# Patient Record
Sex: Male | Born: 1990 | Race: Black or African American | Hispanic: No | Marital: Single | Smoking: Never smoker
Health system: Southern US, Community
[De-identification: ages and names within clinical notes are randomized; demographics above are authoritative.]

## PROBLEM LIST (undated history)

## (undated) DIAGNOSIS — R947 Abnormal results of other endocrine function studies: Secondary | ICD-10-CM

## (undated) DIAGNOSIS — I2109 ST elevation (STEMI) myocardial infarction involving other coronary artery of anterior wall: Secondary | ICD-10-CM

## (undated) DIAGNOSIS — Z9861 Coronary angioplasty status: Secondary | ICD-10-CM

## (undated) DIAGNOSIS — I2510 Atherosclerotic heart disease of native coronary artery without angina pectoris: Secondary | ICD-10-CM

## (undated) HISTORY — PX: OTHER SURGICAL HISTORY: SHX169

---

## 2014-09-29 ENCOUNTER — Emergency Department (HOSPITAL_COMMUNITY)

## 2014-09-29 ENCOUNTER — Encounter (HOSPITAL_COMMUNITY): Payer: Self-pay | Admitting: Cardiovascular Disease

## 2014-09-29 ENCOUNTER — Encounter (HOSPITAL_COMMUNITY): Admission: EM | Source: Home / Self Care | Attending: Cardiovascular Disease

## 2014-09-29 ENCOUNTER — Inpatient Hospital Stay (HOSPITAL_COMMUNITY)
Admission: EM | Admit: 2014-09-29 | Discharge: 2014-10-03 | DRG: 249 | Attending: Cardiovascular Disease | Admitting: Cardiovascular Disease

## 2014-09-29 ENCOUNTER — Inpatient Hospital Stay (HOSPITAL_COMMUNITY)

## 2014-09-29 DIAGNOSIS — I2510 Atherosclerotic heart disease of native coronary artery without angina pectoris: Secondary | ICD-10-CM

## 2014-09-29 DIAGNOSIS — Z9861 Coronary angioplasty status: Secondary | ICD-10-CM

## 2014-09-29 DIAGNOSIS — R001 Bradycardia, unspecified: Secondary | ICD-10-CM | POA: Diagnosis not present

## 2014-09-29 DIAGNOSIS — I252 Old myocardial infarction: Secondary | ICD-10-CM | POA: Diagnosis not present

## 2014-09-29 DIAGNOSIS — I2111 ST elevation (STEMI) myocardial infarction involving right coronary artery: Secondary | ICD-10-CM

## 2014-09-29 DIAGNOSIS — I2102 ST elevation (STEMI) myocardial infarction involving left anterior descending coronary artery: Secondary | ICD-10-CM

## 2014-09-29 DIAGNOSIS — I2109 ST elevation (STEMI) myocardial infarction involving other coronary artery of anterior wall: Principal | ICD-10-CM

## 2014-09-29 DIAGNOSIS — W19XXXA Unspecified fall, initial encounter: Secondary | ICD-10-CM

## 2014-09-29 DIAGNOSIS — Z79899 Other long term (current) drug therapy: Secondary | ICD-10-CM | POA: Diagnosis not present

## 2014-09-29 DIAGNOSIS — R0789 Other chest pain: Secondary | ICD-10-CM | POA: Diagnosis not present

## 2014-09-29 DIAGNOSIS — R109 Unspecified abdominal pain: Secondary | ICD-10-CM | POA: Diagnosis present

## 2014-09-29 DIAGNOSIS — Z7902 Long term (current) use of antithrombotics/antiplatelets: Secondary | ICD-10-CM | POA: Diagnosis not present

## 2014-09-29 DIAGNOSIS — Z7982 Long term (current) use of aspirin: Secondary | ICD-10-CM | POA: Diagnosis not present

## 2014-09-29 DIAGNOSIS — R079 Chest pain, unspecified: Secondary | ICD-10-CM | POA: Diagnosis present

## 2014-09-29 HISTORY — PX: LEFT HEART CATHETERIZATION WITH CORONARY ANGIOGRAM: SHX5451

## 2014-09-29 HISTORY — DX: Coronary angioplasty status: Z98.61

## 2014-09-29 HISTORY — DX: ST elevation (STEMI) myocardial infarction involving other coronary artery of anterior wall: I21.09

## 2014-09-29 HISTORY — DX: Abnormal results of other endocrine function studies: R94.7

## 2014-09-29 HISTORY — DX: Atherosclerotic heart disease of native coronary artery without angina pectoris: I25.10

## 2014-09-29 LAB — CBC WITH DIFFERENTIAL/PLATELET
BASOS ABS: 0 10*3/uL (ref 0.0–0.1)
Basophils Relative: 0 % (ref 0–1)
EOS ABS: 0 10*3/uL (ref 0.0–0.7)
Eosinophils Relative: 0 % (ref 0–5)
HCT: 41.7 % (ref 39.0–52.0)
HEMOGLOBIN: 14.7 g/dL (ref 13.0–17.0)
Lymphocytes Relative: 13 % (ref 12–46)
Lymphs Abs: 1.6 10*3/uL (ref 0.7–4.0)
MCH: 31.4 pg (ref 26.0–34.0)
MCHC: 35.3 g/dL (ref 30.0–36.0)
MCV: 89.1 fL (ref 78.0–100.0)
MONOS PCT: 4 % (ref 3–12)
Monocytes Absolute: 0.6 10*3/uL (ref 0.1–1.0)
Neutro Abs: 10.8 10*3/uL — ABNORMAL HIGH (ref 1.7–7.7)
Neutrophils Relative %: 83 % — ABNORMAL HIGH (ref 43–77)
Platelets: 334 10*3/uL (ref 150–400)
RBC: 4.68 MIL/uL (ref 4.22–5.81)
RDW: 12.4 % (ref 11.5–15.5)
WBC: 13 10*3/uL — ABNORMAL HIGH (ref 4.0–10.5)

## 2014-09-29 LAB — BASIC METABOLIC PANEL
ANION GAP: 19 — AB (ref 5–15)
BUN: 14 mg/dL (ref 6–23)
CO2: 20 mEq/L (ref 19–32)
CREATININE: 1.29 mg/dL (ref 0.50–1.35)
Calcium: 10.6 mg/dL — ABNORMAL HIGH (ref 8.4–10.5)
Chloride: 102 mEq/L (ref 96–112)
GFR calc Af Amer: >90 mL/min (ref >90)
GFR, EST NON AFRICAN AMERICAN: 78 mL/min — AB (ref >90)
Glucose, Bld: 86 mg/dL (ref 70–99)
Potassium: 3.8 mEq/L (ref 3.7–5.3)
Sodium: 141 mEq/L (ref 137–147)

## 2014-09-29 LAB — MAGNESIUM: Magnesium: 2 mg/dL (ref 1.5–2.5)

## 2014-09-29 LAB — TROPONIN I: Troponin I: 0.4 ng/mL (ref <0.30)

## 2014-09-29 LAB — POCT ACTIVATED CLOTTING TIME: Activated Clotting Time: 653 seconds

## 2014-09-29 LAB — I-STAT TROPONIN, ED: Troponin i, poc: 0.21 ng/mL (ref 0.00–0.08)

## 2014-09-29 LAB — MRSA PCR SCREENING: MRSA by PCR: NEGATIVE

## 2014-09-29 SURGERY — LEFT HEART CATHETERIZATION WITH CORONARY ANGIOGRAM
Anesthesia: LOCAL

## 2014-09-29 MED ORDER — HEPARIN (PORCINE) IN NACL 2-0.9 UNIT/ML-% IJ SOLN
INTRAMUSCULAR | Status: AC
  Filled 2014-09-29: qty 1000

## 2014-09-29 MED ORDER — ASPIRIN 81 MG PO CHEW
CHEWABLE_TABLET | ORAL | Status: AC
  Filled 2014-09-29: qty 4

## 2014-09-29 MED ORDER — HEPARIN BOLUS VIA INFUSION
4000.0000 [IU] | Freq: Once | INTRAVENOUS | Status: AC
  Administered 2014-09-29: 5000 [IU] via INTRAVENOUS
  Filled 2014-09-29: qty 4000

## 2014-09-29 MED ORDER — NITROGLYCERIN 0.4 MG SL SUBL: 0.4000 mg | SUBLINGUAL_TABLET | SUBLINGUAL | Status: DC | PRN

## 2014-09-29 MED ORDER — SODIUM CHLORIDE 0.9 % IV SOLN: INTRAVENOUS | Status: DC

## 2014-09-29 MED ORDER — TICAGRELOR 90 MG PO TABS
ORAL_TABLET | ORAL | Status: AC
  Filled 2014-09-29: qty 1

## 2014-09-29 MED ORDER — ASPIRIN 81 MG PO CHEW
324.0000 mg | CHEWABLE_TABLET | Freq: Once | ORAL | Status: AC
  Administered 2014-09-29: 324 mg via ORAL

## 2014-09-29 MED ORDER — HEPARIN (PORCINE) IN NACL 2-0.9 UNIT/ML-% IJ SOLN
INTRAMUSCULAR | Status: AC
  Filled 2014-09-29: qty 500

## 2014-09-29 MED ORDER — MORPHINE SULFATE 2 MG/ML IJ SOLN
INTRAMUSCULAR | Status: AC
  Filled 2014-09-29: qty 1

## 2014-09-29 MED ORDER — MORPHINE SULFATE 2 MG/ML IJ SOLN
2.0000 mg | INTRAMUSCULAR | Status: DC | PRN
  Administered 2014-09-29 – 2014-09-30 (×3): 2 mg via INTRAVENOUS
  Filled 2014-09-29 (×3): qty 1

## 2014-09-29 MED ORDER — ATORVASTATIN CALCIUM 80 MG PO TABS
80.0000 mg | ORAL_TABLET | Freq: Every day | ORAL | Status: DC
  Administered 2014-09-29 – 2014-10-02 (×4): 80 mg via ORAL
  Filled 2014-09-29 (×4): qty 1

## 2014-09-29 MED ORDER — HEPARIN SODIUM (PORCINE) 5000 UNIT/ML IJ SOLN: 60.0000 [IU]/kg | Freq: Once | INTRAMUSCULAR | Status: DC

## 2014-09-29 MED ORDER — ONDANSETRON HCL 4 MG/2ML IJ SOLN
4.0000 mg | Freq: Four times a day (QID) | INTRAMUSCULAR | Status: DC | PRN
  Administered 2014-09-30: 4 mg via INTRAVENOUS
  Filled 2014-09-29: qty 2

## 2014-09-29 MED ORDER — FENTANYL CITRATE 0.05 MG/ML IJ SOLN
INTRAMUSCULAR | Status: AC
  Filled 2014-09-29: qty 2

## 2014-09-29 MED ORDER — HEPARIN SODIUM (PORCINE) 1000 UNIT/ML IJ SOLN
INTRAMUSCULAR | Status: AC
  Filled 2014-09-29: qty 1

## 2014-09-29 MED ORDER — OXYCODONE-ACETAMINOPHEN 5-325 MG PO TABS: 1.0000 | ORAL_TABLET | ORAL | Status: DC | PRN

## 2014-09-29 MED ORDER — ASPIRIN 81 MG PO CHEW
81.0000 mg | CHEWABLE_TABLET | Freq: Every day | ORAL | Status: DC
  Administered 2014-09-30 – 2014-10-03 (×4): 81 mg via ORAL
  Filled 2014-09-29 (×4): qty 1

## 2014-09-29 MED ORDER — NITROGLYCERIN 1 MG/10 ML FOR IR/CATH LAB
INTRA_ARTERIAL | Status: AC
  Filled 2014-09-29: qty 10

## 2014-09-29 MED ORDER — METOPROLOL TARTRATE 25 MG PO TABS
25.0000 mg | ORAL_TABLET | Freq: Two times a day (BID) | ORAL | Status: DC
  Administered 2014-09-30 – 2014-10-01 (×2): 25 mg via ORAL
  Filled 2014-09-29 (×5): qty 1

## 2014-09-29 MED ORDER — NITROGLYCERIN 0.4 MG SL SUBL
SUBLINGUAL_TABLET | SUBLINGUAL | Status: AC
  Filled 2014-09-29: qty 1

## 2014-09-29 MED ORDER — TICAGRELOR 90 MG PO TABS
90.0000 mg | ORAL_TABLET | Freq: Two times a day (BID) | ORAL | Status: DC
  Administered 2014-09-30 – 2014-10-03 (×7): 90 mg via ORAL
  Filled 2014-09-29 (×8): qty 1

## 2014-09-29 MED ORDER — LIDOCAINE HCL (PF) 1 % IJ SOLN
INTRAMUSCULAR | Status: AC
  Filled 2014-09-29: qty 30

## 2014-09-29 MED ORDER — MORPHINE SULFATE 4 MG/ML IJ SOLN: 4.0000 mg | INTRAMUSCULAR | Status: DC | PRN

## 2014-09-29 MED ORDER — BIVALIRUDIN 250 MG IV SOLR
INTRAVENOUS | Status: AC
  Filled 2014-09-29: qty 250

## 2014-09-29 MED ORDER — VERAPAMIL HCL 2.5 MG/ML IV SOLN
INTRAVENOUS | Status: AC
  Filled 2014-09-29: qty 2

## 2014-09-29 MED ORDER — MIDAZOLAM HCL 2 MG/2ML IJ SOLN
INTRAMUSCULAR | Status: AC
  Filled 2014-09-29: qty 2

## 2014-09-29 MED ORDER — ONDANSETRON HCL 4 MG/2ML IJ SOLN: 4.0000 mg | Freq: Once | INTRAMUSCULAR | Status: DC

## 2014-09-29 MED ORDER — ACETAMINOPHEN 325 MG PO TABS: 650.0000 mg | ORAL_TABLET | ORAL | Status: DC | PRN

## 2014-09-29 MED ORDER — METOPROLOL TARTRATE 1 MG/ML IV SOLN
INTRAVENOUS | Status: AC
  Filled 2014-09-29: qty 5

## 2014-09-29 MED ORDER — HEPARIN (PORCINE) IN NACL 100-0.45 UNIT/ML-% IJ SOLN
1050.0000 [IU]/h | INTRAMUSCULAR | Status: DC
  Filled 2014-09-29: qty 250
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
## 2014-09-30 ENCOUNTER — Encounter (HOSPITAL_COMMUNITY): Payer: Self-pay | Admitting: Neurology

## 2014-09-30 DIAGNOSIS — R109 Unspecified abdominal pain: Secondary | ICD-10-CM

## 2014-09-30 DIAGNOSIS — R072 Precordial pain: Secondary | ICD-10-CM

## 2014-09-30 DIAGNOSIS — I2510 Atherosclerotic heart disease of native coronary artery without angina pectoris: Secondary | ICD-10-CM

## 2014-09-30 DIAGNOSIS — Z9861 Coronary angioplasty status: Secondary | ICD-10-CM

## 2014-09-30 DIAGNOSIS — I2109 ST elevation (STEMI) myocardial infarction involving other coronary artery of anterior wall: Principal | ICD-10-CM

## 2014-09-30 LAB — RAPID URINE DRUG SCREEN, HOSP PERFORMED
AMPHETAMINES: NOT DETECTED
BENZODIAZEPINES: POSITIVE — AB
Barbiturates: NOT DETECTED
Cocaine: NOT DETECTED
OPIATES: POSITIVE — AB
Tetrahydrocannabinol: POSITIVE — AB

## 2014-09-30 LAB — LIPID PANEL
Cholesterol: 135 mg/dL (ref 0–200)
HDL: 38 mg/dL — ABNORMAL LOW (ref >39)
LDL CALC: 80 mg/dL (ref 0–99)
Total CHOL/HDL Ratio: 3.6 RATIO
Triglycerides: 84 mg/dL (ref <150)
VLDL: 17 mg/dL (ref 0–40)

## 2014-09-30 LAB — TROPONIN I
TROPONIN I: 3.86 ng/mL — AB (ref <0.30)
Troponin I: 16.35 ng/mL (ref <0.30)
Troponin I: 12.23 ng/mL (ref <0.30)

## 2014-09-30 MED FILL — Sodium Chloride IV Soln 0.9%: INTRAVENOUS | Qty: 50 | Status: AC

## 2014-09-30 NOTE — Progress Notes (Signed)
CARDIAC REHAB PHASE I   PRE:  Rate/Rhythm: 66 SR  BP:  Supine:   Sitting: 127/64  Standing:    SaO2:   MODE:  Ambulation: 226 ft   POST:  Rate/Rhythm: 78 SR to 54 SB then to 84 SR  BP:  Supine 111/68    136/76 Sitting:   Standing:    SaO2: 100% RA  Put on 2L 1330-1405 Pt standing with his RN when I entered room getting ready to walk so I took over. Encouraged pt to let me know if any CP or dizziness. Pt walked 150 ft when he stopped suddenly and closed eyes. Stated he was nauseated and felt weak. Had chair brought to pt and he sat encouraging him to take deep breaths. Heart rate stable then. Pt able to walk 76 ft back to room and sat on side of bed. He put head down and heart rate started dropping to 54. Told pt to lie down and helped him with his feet. Heart rate immediately to 84. Asked pt if he felt like he was going to pass out when he was sitting and he said yes. BP at 111/68 and then to 136/76. Pt started rubbing chest. Asked him if chest hurting and he stated yes. 4 on scale. Put on 2L oxygen which did not lessen pain and notified pt's RN who came in. Pt medicated.  Asked if pt could possibly sit in recliner later when feeling better to get used to position change and officers said they could shackle to recliner. We will follow up tomorrow.   Luetta Nuttingharlene Noelle Sease, RN BSN  09/30/2014 2:00 PM

## 2014-09-30 NOTE — Plan of Care (Signed)
Problem: Consults Goal: Tobacco Cessation referral if indicated Outcome: Not Applicable Date Met:  09/30/14     

## 2014-09-30 NOTE — Care Management Note (Signed)
    Page 1 of 1   09/30/2014     10:08:52 AM CARE MANAGEMENT NOTE 09/30/2014  Patient:  William Hanna,William Hanna   Account Number:  [id number]  Date Initiated:  09/30/2014  Documentation initiated by:  William Hanna  Subjective/Objective Assessment:   adm w mi     Action/Plan:   was at home pta   Anticipated DC Date:     Anticipated DC Plan:  [facility] FAC[facility]      DC Planning Services  CM consult  Medication Assistance      Choice offered to / List presented to:             Status of service:   Medicare Important Message given?   (If response is "NO", the following Medicare IM given date fields will be blank) Date Medicare IM given:   Medicare IM given by:   Date Additional Medicare IM given:   Additional Medicare IM given by:    Discharge Disposition:    Per UR Regulation:  Reviewed for med. necessity/level of care/duration of stay  If discussed at Long Length of Stay Meetings, dates discussed:    Comments:  11/12 1007 William Hanna pt has police in room guarding him. left brilinta 30day free card w guard. placed brilinta pt assist form on shadow chart.

## 2014-09-30 NOTE — Progress Notes (Signed)
CRITICAL VALUE ALERT  Critical value received:  Troponin 3.86    Date of notification:09/30/2014  Time of notification:  0054  Critical value read back:Yes.    Nurse who received alert:  Kavin LeechSondra Garett Tetzloff, RN   MD notified (1st page):  Critical value expected, MD aware   Ivery QualeJackson, Quinton Voth A, RN

## 2014-09-30 NOTE — Progress Notes (Signed)
  Echocardiogram 2D Echocardiogram has been performed.  [name], William Hanna 09/30/2014, 5:09 PM

## 2014-09-30 NOTE — Progress Notes (Signed)
SUBJECTIVE:  Doing well, denies any ongoing chest pain or SOB. Chest tender to palpation.  Right flank tenderness.  OBJECTIVE:   Vitals:   Filed Vitals:   09/30/14 0700 09/30/14 0800 09/30/14 0900 09/30/14 1000  BP: 114/55 113/96 131/60 133/75  Pulse: 55 84 59 58  Temp:  98.3 F (36.8 C)    TempSrc:  Oral    Resp: 13 21 15 16   Height:      Weight:      SpO2: 98% 100% 99% 99%   I&O's:   Intake/Output Summary (Last 24 hours) at 09/30/14 1126 Last data filed at 09/30/14 0215  Gross per 24 hour  Intake    535 ml  Output    375 ml  Net    160 ml   TELEMETRY: Reviewed telemetry pt in NSR:   PHYSICAL EXAM General: Well developed, well nourished, in no acute distress Head:   Normal cephalic and atramatic  Lungs:  Clear bilaterally to auscultation. Heart:  HRRR S1 S2  No JVD.   Abdomen: abdomen soft, RUQ tenderness Msk: right flank tenderness, tenderness of left sternal boarder. Extremities:  No edema.  Right radial access site c/d/i. Minimal tenderness Neuro: Alert and oriented. Psych:  Normal affect, responds appropriately Skin: No rash   LABS: Basic Metabolic Panel:  Recent Labs  81/19/1410/10/03 1814  NA 141  K 3.8  CL 102  CO2 20  GLUCOSE 86  BUN 14  CREATININE 1.29  CALCIUM 10.6*  MG 2.0   Liver Function Tests: No results for input(s): AST, ALT, ALKPHOS, BILITOT, PROT, ALBUMIN in the last 72 hours. No results for input(s): LIPASE, AMYLASE in the last 72 hours. CBC:  Recent Labs  09/29/14 1814  WBC 13.0*  NEUTROABS 10.8*  HGB 14.7  HCT 41.7  MCV 89.1  PLT 334   Cardiac Enzymes:  Recent Labs  09/29/14 1814 09/29/14 2355 09/30/14 0235  TROPONINI 0.40* 3.86* 16.35*   BNP: Invalid input(s): POCBNP D-Dimer: No results for input(s): DDIMER in the last 72 hours. Hemoglobin A1C: No results for input(s): HGBA1C in the last 72 hours. Fasting Lipid Panel:  Recent Labs  09/30/14 0235  CHOL 135  HDL 38*  LDLCALC 80  TRIG 84  CHOLHDL 3.6    Thyroid Function Tests: No results for input(s): TSH, T4TOTAL, T3FREE, THYROIDAB in the last 72 hours.  Invalid input(s): FREET3 Anemia Panel: No results for input(s): VITAMINB12, FOLATE, FERRITIN, TIBC, IRON, RETICCTPCT in the last 72 hours. Coag Panel:   No results found for: INR, PROTIME  RADIOLOGY: Dg Chest 2 View  09/29/2014   CLINICAL DATA:  Larey SeatFell and hit chest.  EXAM: CHEST  2 VIEW  COMPARISON:  No prior.  FINDINGS: Mediastinum and hilar structures are normal. Lungs are clear. No pleural effusion or pneumothorax. Heart size normal. No acute bony abnormality.  IMPRESSION: No acute cardiopulmonary disease.   Electronically Signed   By: Maisie Fushomas  Register   On: 09/29/2014 17:55   Dg Abd Portable 1v  09/29/2014   CLINICAL DATA:  Abdominal pain  EXAM: PORTABLE ABDOMEN - 1 VIEW  COMPARISON:  None.  FINDINGS: Scattered large and small bowel gas is noted. Contrast material is noted within the renal collecting systems and bladder likely related to recent cardiac catheterization. No free air is seen. No obstructive changes are noted. No bony abnormality is noted.  IMPRESSION: Nonspecific abdomen.   Electronically Signed   By: Alcide CleverMark  Lukens M.D.   On: 09/29/2014 22:00  ASSESSMENT: William Hanna is a 23 year old male who [facility] with [facility] anterior/inferior STEMI, he was subsequently found to have 80% stenosis of the LAD likely secondary to ruptured plaque, which was stented with a BMS.  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  His LAD raps around the apex as well.  His STEMI was likely secondary to a plaque rupture causing decreased flow to the distal area of his LAD.  This has now been stented and flow resumed.  He is doing well post cath. - Will need at least 1 month of DAPT. Started on ASA and  Brilinta.  Patient on Metoprolol 25mg  BID, Atorvastatin 80mg  daily.  Have provided sherriff's deputies a list of these medication to ensure that they can be provided in jail.  They will let us know tomorrow if they cannot and what alternatives can be provided.   -TTE ordered. -Transfer to Telemetry if he does OK with [facility] - Ambulate patient  Right Flank Pain - Likely MSK from trauma.  No abnormalities on CXR or Abdominal films.  If persists or worsens can consider CT for further evaluation.  Positive UDS: Positive for BZD, Opiates, and THC.  BZD and opiates may be explained by medications used during cardiac cath (UDS obtained post cath).  Gust RungHoffman, Erik C, DO  09/30/2014  11:26 AM   I've seen the patient this morning along with Dr. Mikey BussingHoffman on rounds.  Together we reviewed the available data in the chart. Exam was performed together - within the representing my examination.. We discussed the plan as noted above.  23 year old young man status post MI with initially inferior ST elevations followed by hyperacute ST segments in the anterior leads was taken to the cardiac Cath Lab and found to have a proximal LAD ruptured plaque and thrombus and treated with internal stent.  He did have some abdominal and flank pain that was concerning for possible injury during his fall prior to being brought to the emergency room.  I do suspect that his urine drug screen was positive for benzodiazepines and opiates from sedation and Procedure. He was negative for cocaine and other substances.  Alert and do cardiac rehabilitation today he seems somewhat tired and fatigued. Depending on how he does with this then transferred to the floor possible discharge tomorrow. However, if he has issues with cardiac rehabilitation, we'll monitor him 41 more day in the step down unit and not anticipate early discharge.  He is on aspirin plus Brilinta, beta blocker as well as atorvastatin. We will need to ensure that upon  leaving the hospital he will be able to obtain these medications once arrives at the jail. -- May need to convert to Plavix. Followup echo today.  Marykay LexHARDING,DAVID W, M.D., M.S. Interventional Cardiologist   Pager # [phone]

## 2014-09-30 NOTE — Plan of Care (Signed)
Problem: Consults Goal: Skin Care Protocol Initiated - if Braden Score 18 or less If consults are not indicated, leave blank or document N/A Outcome: Not Applicable Date Met:  97/33/12

## 2014-09-30 NOTE — Progress Notes (Signed)
   09/30/14 1300  Clinical Encounter Type  Visited With Patient  Visit Type Initial   Chaplain was referred to patient via spiritual care consult. Three police officers were in the room with the patient. Patient was sleeping when chaplain visited. Page Merrilyn Puman-Call chaplain if patient requests spiritual or emotional support. Cranston NeighborStrother, Rosemae Mcquown R, Chaplain  1:03 PM

## 2014-09-30 NOTE — Plan of Care (Signed)
Problem: Consults Goal: Cardiac Cath Patient Education (See Patient Education module for education specifics.) Outcome: Completed/Met Date Met:  09/30/14  Problem: Phase I Progression Outcomes Goal: Pain controlled with appropriate interventions Outcome: Completed/Met Date Met:  09/30/14 Goal: Initial discharge plan identified Outcome: Completed/Met Date Met:  09/30/14 Goal: Voiding-avoid urinary catheter unless indicated Outcome: Completed/Met Date Met:  09/30/14 Goal: Hemodynamically stable Outcome: Completed/Met Date Met:  09/30/14 Goal: Distal pulses equal to baseline Outcome: Completed/Met Date Met:  09/30/14 Goal: Vascular site scale level 0 - I Vascular Site Scale Level 0: No bruising/bleeding/hematoma Level I (Mild): Bruising/Ecchymosis, minimal bleeding/ooozing, palpable hematoma < 3 cm Level II (Moderate): Bleeding not affecting hemodynamic parameters, pseudoaneurysm, palpable hematoma > 3 cm Level III (Severe) Bleeding which affects hemodynamic parameters or retroperitoneal hemorrhage  Outcome: Completed/Met Date Met:  09/30/14     

## 2014-09-30 NOTE — Progress Notes (Addendum)
Pt up with cardiac rehab to walk.  Got dizzy, nauseated and started having chest pressure 4/10.  Morphine 2mg , zofran IV given, and EKG done.  Dr Herbie BaltimoreHarding compaired it to previous EKGs.  No furthuer intervention at this time.  Pt chest pressure subsiding after rest and morphine.  MD aware.     Trasfer order to tele cancelled.    Eliane DecreeSmith, Lain Tetterton Moore, RN

## 2014-10-01 NOTE — Progress Notes (Signed)
CARDIAC REHAB PHASE I   PRE:  Rate/Rhythm: 59 SB  BP:  Supine:   Sitting: 122/73  Standing:    SaO2: 100 RA  MODE:  Ambulation: 700 ft   POST:  Rate/Rhythm: 78 SR  BP:  Supine:   Sitting: 121/72  Standing:    SaO2: 100 RA 1510-1545 Pt tolerated ambulation well without c/o of cp or SOB. VS stable HR 60-70 SR walking. Pt to recliner after walk with call light in reach. Guards present with pt. Completed MI and stent education with pt. He voices understanding. Pt cried during education and states that having a heart attack is scary. Noted with pt in recliner that his HR dipped to 43, then returned to 50's. Pt is not appropriate for Outpt. CRP due to being incarcerated.  Melina CopaLisa Tamarcus Condie RN 10/01/2014 4:13 PM

## 2014-10-01 NOTE — Progress Notes (Signed)
SUBJECTIVE:  Episode of chest pain with nausea yesterday during ambulation.  He reports his abdominal pain has improved, denies any nausea, dizziness, SOB, or chest pain.  Has not had appetite to eat and has not eaten much.  OBJECTIVE:   Vitals:   Filed Vitals:   10/01/14 0900 10/01/14 1000 10/01/14 1100 10/01/14 1115  BP: 137/59 126/65 133/70 133/70  Pulse: 55 54 56 58  Temp:    98.4 F (36.9 C)  TempSrc:    Oral  Resp: 15 15 17 13   Height:      Weight:      SpO2: 97% 100% 100% 100%   I&O's:    Intake/Output Summary (Last 24 hours) at 10/01/14 1240 Last data filed at 10/01/14 1100  Gross per 24 hour  Intake    480 ml  Output   1075 ml  Net   -595 ml   TELEMETRY: Reviewed telemetry pt in sinus bradycardia:   PHYSICAL EXAM General: Well developed, well nourished, in no acute distress Head:   Normal cephalic and atramatic  Lungs:  Clear bilaterally to auscultation. Heart:  HRRR S1 S2  No JVD.   Abdomen: abdomen soft, minimal RUQ and flank tenderness Msk: right flank tenderness, tenderness of left sternal boarder. Extremities:  No edema.  Right radial access site c/d/i. Minimal tenderness Neuro: Alert and oriented. Psych:  Normal affect, responds appropriately Skin: No rash   LABS: Basic Metabolic Panel:  Recent Labs  65/78/4610/10/03 1814  NA 141  K 3.8  CL 102  CO2 20  GLUCOSE 86  BUN 14  CREATININE 1.29  CALCIUM 10.6*  MG 2.0   Liver Function Tests: No results for input(s): AST, ALT, ALKPHOS, BILITOT, PROT, ALBUMIN in the last 72 hours. No results for input(s): LIPASE, AMYLASE in the last 72 hours. CBC:  Recent Labs  09/29/14 1814  WBC 13.0*  NEUTROABS 10.8*  HGB 14.7  HCT 41.7  MCV 89.1  PLT 334   Cardiac Enzymes:  Recent Labs  09/29/14 2355 09/30/14 0235 09/30/14 1130  TROPONINI 3.86* 16.35* 12.23*   BNP: Invalid input(s): POCBNP D-Dimer: No results for input(s): DDIMER in the last 72 hours. Hemoglobin A1C: No results for input(s):  HGBA1C in the last 72 hours. Fasting Lipid Panel:  Recent Labs  09/30/14 0235  CHOL 135  HDL 38*  LDLCALC 80  TRIG 84  CHOLHDL 3.6   Thyroid Function Tests: No results for input(s): TSH, T4TOTAL, T3FREE, THYROIDAB in the last 72 hours.  Invalid input(s): FREET3 Anemia Panel: No results for input(s): VITAMINB12, FOLATE, FERRITIN, TIBC, IRON, RETICCTPCT in the last 72 hours. Coag Panel:   No results found for: INR, PROTIME  RADIOLOGY: Dg Chest 2 View  09/29/2014   CLINICAL DATA:  Larey SeatFell and hit chest.  EXAM: CHEST  2 VIEW  COMPARISON:  No prior.  FINDINGS: Mediastinum and hilar structures are normal. Lungs are clear. No pleural effusion or pneumothorax. Heart size normal. No acute bony abnormality.  IMPRESSION: No acute cardiopulmonary disease.   Electronically Signed   By: Maisie Fushomas  Register   On: 09/29/2014 17:55   Dg Abd Portable 1v  09/29/2014   CLINICAL DATA:  Abdominal pain  EXAM: PORTABLE ABDOMEN - 1 VIEW  COMPARISON:  None.  FINDINGS: Scattered large and small bowel gas is noted. Contrast material is noted within the renal collecting systems and bladder likely related to recent cardiac catheterization. No free air is seen. No obstructive changes are noted. No bony abnormality is noted.  IMPRESSION: Nonspecific abdomen.   Electronically Signed   By: Alcide CleverMark  Lukens M.D.   On: 09/29/2014 22:00      ASSESSMENT: William ChimesRasheem Hanna is a 23 year old male who [facility] anterior/inferior STEMI, he was subsequently found to have 80% stenosis of the LAD likely secondary to ruptured plaque, which was stented with a BMS.  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  His LAD raps around the apex as well.  His STEMI was likely secondary to a plaque rupture causing decreased flow to the distal area of  his LAD.  This has now been stented and flow resumed.  He is doing well post cath. - Will need at least 1 month of DAPT. Started on ASA and Brilinta.  Patient on Metoprolol 25mg  BID, Atorvastatin 80mg  daily.  Informed by officers that Brilinta or Effient are not available on jail formulary Plavix is avaliable.  Will need change to Plavix on discharge to continue DAPT.  Otherwise medications are available. -TTE obtained but not yet read. - Ambulate patient with cardiac rehab.  If ambulates OK will transfer to Telemetry with plan to likely discharge in AM -Encouraged patient to eat.  Right Flank Pain - Likely MSK from trauma.  Improving.   Gust RungHoffman, Erik C, DO  10/01/2014  12:40 PM  ATTENDING ATTESTATION  I've seen the patient this morning along with Dr. Mikey BussingHoffman on rounds. Together we reviewed the available data in the chart. Exam was performed together - within the representing my examination.. We discussed the plan as noted above.  23 year old young man status post MI with initially inferior ST elevations followed by hyperacute ST segments in the anterior leads was taken to the cardiac Cath Lab and found to have a proximal LAD ruptured plaque and thrombus and treated with bare-metal stent.  He feels a lot better today, but are still tired. Doesn't really feel like eating. I think what happened yesterday was that he was not eating, and had antihypertensives on board and became nauseated and hypotensive. Unfortunately with his blood pressure is not really really tolerate much in way of antibiotics besides low-dose metoprolol. We will continue his statin and dual antiplatelets. Have to use Plavix on discharge.  Unfortunately with his failed attempt at ambulating yesterday, we still need to make sure these trauma to be up walking around without having angina. His EKG at baseline shows some repolarization changes, but unlikely to be ischemic.  I think he just is in a bit more and be up walking.  Provided she can ambulate without difficulty, he should be transferred to the telemetry this afternoon, and anticipate discharge tomorrow.  He is on aspirin plus Brilinta, beta blocker as well as atorvastatin. -- Convert to Plavix on discharge  Echo was not yet read.   Marykay LexHARDING,Liberti Appleton W, M.D., M.S. Interventional Cardiologist   Pager # [phone]   ]

## 2014-10-02 MED ORDER — LORAZEPAM 2 MG/ML IJ SOLN
1.0000 mg | Freq: Four times a day (QID) | INTRAMUSCULAR | Status: DC
  Administered 2014-10-02: 1 mg via INTRAVENOUS

## 2014-10-02 MED ORDER — ATORVASTATIN CALCIUM 80 MG PO TABS: 80.0000 mg | ORAL_TABLET | Freq: Every day | ORAL | Status: DC

## 2014-10-02 MED ORDER — LORAZEPAM 2 MG/ML IJ SOLN
INTRAMUSCULAR | Status: AC
  Filled 2014-10-02: qty 1

## 2014-10-02 MED ORDER — NITROGLYCERIN 0.4 MG SL SUBL: 0.4000 mg | SUBLINGUAL_TABLET | SUBLINGUAL | Status: DC | PRN

## 2014-10-02 MED ORDER — LORAZEPAM 2 MG/ML IJ SOLN
1.0000 mg | Freq: Four times a day (QID) | INTRAMUSCULAR | Status: DC | PRN
  Administered 2014-10-02: 1 mg via INTRAVENOUS
  Filled 2014-10-02: qty 1

## 2014-10-02 MED ORDER — ASPIRIN 81 MG PO CHEW: 81.0000 mg | CHEWABLE_TABLET | Freq: Every day | ORAL | Status: DC

## 2014-10-02 MED ORDER — METOPROLOL TARTRATE 25 MG PO TABS: 25.0000 mg | ORAL_TABLET | Freq: Two times a day (BID) | ORAL | Status: DC
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
## 2014-10-02 NOTE — Plan of Care (Signed)
Pt's hr is in 40's.  Metoprolol held.  Informed Terance IcePaulette Tepe, RN and she informed Cline CrockKathryn Thompson, [place].  No new orders received.  Dwaine DeterAnthony Azarie Coriz, student nurse, [facility]

## 2014-10-02 NOTE — Plan of Care (Signed)
Pt's hr is in 40's.  Metoprolol held.  Informed Paulette Tepe, RN and she informed Kathryn Thompson, PA.  No new orders received.  Anthony Manecci, student nurse, [employer] 
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
## 2014-10-03 ENCOUNTER — Encounter (HOSPITAL_COMMUNITY): Payer: Self-pay | Admitting: *Deleted

## 2014-10-03 ENCOUNTER — Emergency Department (HOSPITAL_COMMUNITY)
Admission: EM | Admit: 2014-10-03 | Discharge: 2014-10-03 | Disposition: A | Discharge status: Home / Self Care | Attending: Emergency Medicine | Admitting: Emergency Medicine

## 2014-10-03 ENCOUNTER — Emergency Department (HOSPITAL_COMMUNITY)

## 2014-10-03 DIAGNOSIS — Z7902 Long term (current) use of antithrombotics/antiplatelets: Secondary | ICD-10-CM | POA: Insufficient documentation

## 2014-10-03 DIAGNOSIS — R079 Chest pain, unspecified: Secondary | ICD-10-CM

## 2014-10-03 DIAGNOSIS — R0789 Other chest pain: Secondary | ICD-10-CM | POA: Diagnosis not present

## 2014-10-03 DIAGNOSIS — Z7982 Long term (current) use of aspirin: Secondary | ICD-10-CM | POA: Insufficient documentation

## 2014-10-03 DIAGNOSIS — I2510 Atherosclerotic heart disease of native coronary artery without angina pectoris: Secondary | ICD-10-CM | POA: Insufficient documentation

## 2014-10-03 DIAGNOSIS — I252 Old myocardial infarction: Secondary | ICD-10-CM | POA: Insufficient documentation

## 2014-10-03 DIAGNOSIS — Z79899 Other long term (current) drug therapy: Secondary | ICD-10-CM | POA: Insufficient documentation

## 2014-10-03 LAB — CBC WITH DIFFERENTIAL/PLATELET
BASOS ABS: 0 10*3/uL (ref 0.0–0.1)
Basophils Relative: 0 % (ref 0–1)
Eosinophils Absolute: 0 10*3/uL (ref 0.0–0.7)
Eosinophils Relative: 0 % (ref 0–5)
HEMATOCRIT: 46.4 % (ref 39.0–52.0)
Hemoglobin: 16.7 g/dL (ref 13.0–17.0)
Lymphocytes Relative: 19 % (ref 12–46)
Lymphs Abs: 1.3 10*3/uL (ref 0.7–4.0)
MCH: 32.1 pg (ref 26.0–34.0)
MCHC: 36 g/dL (ref 30.0–36.0)
MCV: 89.1 fL (ref 78.0–100.0)
MONO ABS: 0.4 10*3/uL (ref 0.1–1.0)
Monocytes Relative: 6 % (ref 3–12)
NEUTROS ABS: 4.9 10*3/uL (ref 1.7–7.7)
Neutrophils Relative %: 75 % (ref 43–77)
Platelets: 358 10*3/uL (ref 150–400)
RBC: 5.21 MIL/uL (ref 4.22–5.81)
RDW: 12.4 % (ref 11.5–15.5)
WBC: 6.6 10*3/uL (ref 4.0–10.5)

## 2014-10-03 LAB — COMPREHENSIVE METABOLIC PANEL
ALT: 21 U/L (ref 0–53)
AST: 26 U/L (ref 0–37)
Albumin: 4.4 g/dL (ref 3.5–5.2)
Alkaline Phosphatase: 69 U/L (ref 39–117)
Anion gap: 17 — ABNORMAL HIGH (ref 5–15)
BILIRUBIN TOTAL: 1.2 mg/dL (ref 0.3–1.2)
BUN: 15 mg/dL (ref 6–23)
CHLORIDE: 98 meq/L (ref 96–112)
CO2: 23 meq/L (ref 19–32)
Calcium: 10.2 mg/dL (ref 8.4–10.5)
Creatinine, Ser: 1.18 mg/dL (ref 0.50–1.35)
GFR calc Af Amer: >90 mL/min (ref >90)
GFR calc non Af Amer: 87 mL/min — ABNORMAL LOW (ref >90)
Glucose, Bld: 85 mg/dL (ref 70–99)
Potassium: 4.1 mEq/L (ref 3.7–5.3)
SODIUM: 138 meq/L (ref 137–147)
Total Protein: 8.3 g/dL (ref 6.0–8.3)

## 2014-10-03 LAB — TROPONIN I
Troponin I: 1.78 ng/mL (ref <0.30)
Troponin I: 1.23 ng/mL (ref <0.30)

## 2014-10-03 LAB — CBG MONITORING, ED: Glucose-Capillary: 96 mg/dL (ref 70–99)

## 2014-10-03 LAB — CK TOTAL AND CKMB (NOT AT ARMC)
CK, MB: 1.5 ng/mL (ref 0.3–4.0)
RELATIVE INDEX: 0.7 (ref 0.0–2.5)
Total CK: 202 U/L (ref 7–232)

## 2014-10-03 MED ORDER — CLOPIDOGREL BISULFATE 300 MG PO TABS: 600.0000 mg | ORAL_TABLET | Freq: Every day | ORAL | Status: DC

## 2014-10-03 MED ORDER — CLOPIDOGREL BISULFATE 75 MG PO TABS: 75.0000 mg | ORAL_TABLET | Freq: Every day | ORAL | Status: AC

## 2014-10-03 MED ORDER — METOPROLOL SUCCINATE 12.5 MG HALF TABLET: 12.5000 mg | ORAL_TABLET | Freq: Every day | ORAL | Status: DC

## 2014-10-03 MED ORDER — NITROGLYCERIN 0.4 MG SL SUBL: 0.4000 mg | SUBLINGUAL_TABLET | SUBLINGUAL | Status: AC | PRN

## 2014-10-03 MED ORDER — CLOPIDOGREL BISULFATE 75 MG PO TABS: 75.0000 mg | ORAL_TABLET | Freq: Every day | ORAL | Status: DC

## 2014-10-03 MED ORDER — ATORVASTATIN CALCIUM 80 MG PO TABS: 80.0000 mg | ORAL_TABLET | Freq: Every day | ORAL | Status: AC

## 2014-10-03 MED ORDER — MORPHINE SULFATE 4 MG/ML IJ SOLN
4.0000 mg | Freq: Once | INTRAMUSCULAR | Status: AC
  Administered 2014-10-03: 4 mg via INTRAVENOUS
  Filled 2014-10-03: qty 1

## 2014-10-03 MED ORDER — METOPROLOL SUCCINATE ER 25 MG PO TB24
12.5000 mg | ORAL_TABLET | Freq: Every day | ORAL | Status: DC
  Administered 2014-10-03: 12.5 mg via ORAL
  Filled 2014-10-03: qty 1

## 2014-10-03 MED ORDER — ASPIRIN 81 MG PO CHEW: 81.0000 mg | CHEWABLE_TABLET | Freq: Every day | ORAL | Status: AC

## 2014-10-03 MED ORDER — METOPROLOL SUCCINATE 12.5 MG HALF TABLET: 12.5000 mg | ORAL_TABLET | Freq: Every day | ORAL | Status: AC

## 2014-10-03 NOTE — ED Notes (Signed)
Critical Lab Trop. 1.78 PA notfied.

## 2014-10-03 NOTE — Progress Notes (Signed)
Report called and given to nurse on call receiving patient.
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
## 2014-10-03 NOTE — Discharge Instructions (Signed)
He received his AM dose of Brilinta. Will plan to give him a loading dose of plavix 600mg  tonight at 8pm in place of the PM Brilinta dose. Then start Plavix 75mg  daily thereafter.      Coronary Angiogram A coronary angiogram, also called coronary angiography, is an X-ray procedure used to look at the arteries in the heart. In this procedure, a dye (contrast dye) is injected through a long, hollow tube (catheter). The catheter is about the size of a piece of cooked spaghetti and is inserted through your groin, wrist, or arm. The dye is injected into each artery, and X-rays are then taken to show if there is a blockage in the arteries of your heart. LET [facility] CARE PROVIDER KNOW ABOUT:  Any allergies you have, including allergies to shellfish or contrast dye.   All medicines you are taking, including vitamins, herbs, eye drops, creams, and over-the-counter medicines.   Previous problems you or members of your family have had with the use of anesthetics.   Any blood disorders you have.   Previous surgeries you have had.  History of kidney problems or failure.   Other medical conditions you have. RISKS AND COMPLICATIONS  Generally, a coronary angiogram is a safe procedure. However, problems can occur and include:  Allergic reaction to the dye.  Bleeding from the access site or other locations.  Kidney injury, especially in people with impaired kidney function.  Stroke (rare).  Heart attack (rare). BEFORE THE PROCEDURE   Do not eat or drink anything after midnight the night before the procedure or as directed by your health care provider.   Ask your health care provider about changing or stopping your regular medicines. This is especially important if you are taking diabetes medicines or blood thinners. PROCEDURE  You may be given a medicine to help you relax (sedative) before the procedure. This medicine is given through an intravenous (IV) access tube that is  inserted into one of your veins.   The area where the catheter will be inserted will be washed and shaved. This is usually done in the groin but may be done in the fold of your arm (near your elbow) or in the wrist.   A medicine will be given to numb the area where the catheter will be inserted (local anesthetic).   The health care provider will insert the catheter into an artery. The catheter will be guided by using a special type of X-ray (fluoroscopy) of the blood vessel being examined.   A special dye will then be injected into the catheter, and X-rays will be taken. The dye will help to show where any narrowing or blockages are located in the heart arteries.  AFTER THE PROCEDURE   If the procedure is done through the leg, you will be kept in bed lying flat for several hours. You will be instructed to not bend or cross your legs.  The insertion site will be checked frequently.   The pulse in your feet or wrist will be checked frequently.   Additional blood tests, X-rays, and an electrocardiogram may be done.  Document Released: 05/12/2003 Document Revised: 03/22/2014 Document Reviewed: 03/30/2013 [facility] Patient Information 2015 [place], [place]. This information is not intended to replace advice given to you by your health care provider. Make sure you discuss any questions you have with your health care provider.     Radial Site Care Refer to this sheet in the next few weeks. These instructions provide you with information  on caring for yourself after your procedure. Your caregiver may also give you more specific instructions. Your treatment has been planned according to current medical practices, but problems sometimes occur. Call your caregiver if you have any problems or questions after your procedure. HOME CARE INSTRUCTIONS  You may shower the day after the procedure.Remove the bandage (dressing) and gently wash the site with plain soap and water.Gently pat the site  dry.   Do not apply powder or lotion to the site.   Do not submerge the affected site in water for 3 to 5 days.   Inspect the site at least twice daily.   Do not flex or bend the affected arm for 24 hours.   No lifting over 5 pounds (2.3 kg) for 5 days after your procedure.   Do not drive home if you are discharged the same day of the procedure. Have someone else drive you.   You may drive 24 hours after the procedure unless otherwise instructed by your caregiver.  What to expect:  Any bruising will usually fade within 1 to 2 weeks.   Blood that collects in the tissue (hematoma) may be painful to the touch. It should usually decrease in size and tenderness within 1 to 2 weeks.  SEEK IMMEDIATE MEDICAL CARE IF:  You have unusual pain at the radial site.   You have redness, warmth, swelling, or pain at the radial site.   You have drainage (other than a small amount of blood on the dressing).   You have chills.   You have a fever or persistent symptoms for more than 72 hours.   You have a fever and your symptoms suddenly get worse.   Your arm becomes pale, cool, tingly, or numb.   You have heavy bleeding from the site. Hold pressure on the site.

## 2014-10-03 NOTE — Progress Notes (Signed)
Pt discharged out with officers.  Reviewed discharge instructions and education, all questions answered.  Assessment unchanged from earlier.
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
## 2014-10-03 NOTE — ED Provider Notes (Signed)
CSN: [id number]     Arrival date & time 10/03/14  1509 History   None    Chief Complaint  Patient presents with  . Chest Pain      HPI Patient presents to the emergency department complaining of anterior chest pain.  Patient was discharged to the hospital earlier today after recent treatment with a bare metal stent to his LAD for a plaque rupture.  His heart catheterization and stent was placed on 09/29/2014.  He was released from the hospital today and sent back to jail.  Apparently when he presented to jail he began complaining of anterior chest pain.  He reports his pain is worse with movement and palpation of his chest.  He states this pain feels different than his chest pain he presented with when he had his heart catheterization.  He denies radiation of his pain.  No shortness of breath.  As reported that he was hyperventilating initially for EMS.  He states he feels better at this time.   Past Medical History  Diagnosis Date  . 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 Acute anterior wall MI 09/29/2014  . CAD S/P PCI of pLAD: 4.0 mm x 18 BMS 09/29/2014   Past Surgical History  Procedure Laterality Date  . None     Family History  Problem Relation Age of Onset  . Coronary artery disease Neg Hx    History  Substance Use Topics  . Smoking status: Never Smoker   . Smokeless tobacco: Not on file  . Alcohol Use: No    Review of Systems  All other systems reviewed and are negative.     Allergies  Review of patient's allergies indicates no known allergies.  Home Medications   Prior to Admission medications   Medication Sig Start Date End Date Taking? Authorizing Provider  aspirin 81 MG chewable tablet Chew 1 tablet (81 mg total) by mouth daily. 10/03/14   Janetta HoraKathryn R Thompson, PA-C  atorvastatin (LIPITOR) 80 MG tablet Take 1 tablet (80 mg total) by mouth daily at 6 PM. 10/03/14   Janetta HoraKathryn R Thompson, PA-C   clopidogrel (PLAVIX) 300 MG TABS tablet Take 2 tablets (600 mg total) by mouth daily. 10/03/14   Janetta HoraKathryn R Thompson, PA-C  clopidogrel (PLAVIX) 75 MG tablet Take 1 tablet (75 mg total) by mouth daily. 10/04/14   Janetta HoraKathryn R Thompson, PA-C  metoprolol succinate (TOPROL-XL) 12.5 mg TB24 24 hr tablet Take 0.5 tablets (12.5 mg total) by mouth daily. 10/03/14   Janetta HoraKathryn R Thompson, PA-C  nitroGLYCERIN (NITROSTAT) 0.4 MG SL tablet Place 1 tablet (0.4 mg total) under the tongue every 5 (five) minutes x 3 doses as needed for chest pain. 10/03/14   Janetta HoraKathryn R Thompson, PA-C   There were no vitals taken for this visit. Physical Exam  Constitutional: He is oriented to person, place, and time. He appears well-developed and well-nourished.  HENT:  Head: Normocephalic and atraumatic.  Eyes: EOM are normal.  Neck: Normal range of motion.  Cardiovascular: Normal rate, regular rhythm, normal heart sounds and intact distal pulses.   Pulmonary/Chest: Effort normal and breath sounds normal. No respiratory distress.  Tenderness to palpation of anterior chest.  No crepitus.  No bruising noted.  Abdominal: Soft. He exhibits no distension. There is no tenderness.  Musculoskeletal: Normal range of motion.  Neurological: He is alert and oriented to person, place, and time.  Skin: Skin is warm and dry.  Psychiatric: He has  a normal mood and affect. Judgment normal.  Nursing note and vitals reviewed.   ED Course  Procedures (including critical care time) Labs Review Labs Reviewed  COMPREHENSIVE METABOLIC PANEL - Abnormal; Notable for the following:    GFR calc non Af Amer 87 (*)    Anion gap 17 (*)    All other components within normal limits  TROPONIN I - Abnormal; Notable for the following:    Troponin I 1.78 (*)    All other components within normal limits  TROPONIN I - Abnormal; Notable for the following:    Troponin I 1.23 (*)    All other components within normal limits  CBC WITH DIFFERENTIAL  CK TOTAL  AND CKMB  CBG MONITORING, ED    Imaging Review Dg Chest 2 View  10/03/2014   CLINICAL DATA:  Acute chest pain earlier today. Myocardial infarction last week.  EXAM: CHEST  2 VIEW  COMPARISON:  09/29/2014  FINDINGS: The cardiomediastinal silhouette is within normal limits. Lungs are mildly hypoinflated without evidence of confluent airspace opacity, pulmonary edema, pleural effusion, or pneumothorax. No acute osseous abnormality is identified.  IMPRESSION: No active cardiopulmonary disease.   Electronically Signed   By: Sebastian AcheAllen  Grady   On: 10/03/2014 16:28    ECG interpretation   Date: 10/03/2014  Rate: 63  Rhythm: normal sinus rhythm  QRS Axis: normal  Intervals: normal  ST/T Wave abnormalities: nonspecific T wave changes  Conduction Disutrbances: none  Narrative Interpretation:   Old EKG Reviewed: No significant changes noted     MDM   Final diagnoses:  Chest pain    EKG is unchanged from his prior discharge.  Initial troponin was elevated however this is likely trending down from his prior.  Second set of troponin levels has continued to fall normal CK-MB. I spoke with Dr Armanda Magicraci Turner who agrees and requested the 2nd set of enzymes with the addition of the CK-MB    Lyanne CoKevin M Javion Holmer, MD 10/03/14 2005

## 2014-10-03 NOTE — ED Notes (Signed)
MD at bedside. 

## 2014-10-04 ENCOUNTER — Telehealth (HOSPITAL_COMMUNITY): Payer: Self-pay

## 2014-10-04 NOTE — Telephone Encounter (Signed)
Call from Christianne DolinPat Stevens RN from jail requesting copy of DC papers from visit to ED today,  DC papers faxed to [phone] Attn Christianne DolinPat Stevens RN

## 2014-10-05 ENCOUNTER — Emergency Department (HOSPITAL_COMMUNITY)

## 2014-10-05 ENCOUNTER — Encounter (HOSPITAL_COMMUNITY): Payer: Self-pay | Admitting: Emergency Medicine

## 2014-10-05 ENCOUNTER — Emergency Department (HOSPITAL_COMMUNITY)
Admission: EM | Admit: 2014-10-05 | Discharge: 2014-10-05 | Disposition: A | Discharge status: Home / Self Care | Attending: Emergency Medicine | Admitting: Emergency Medicine

## 2014-10-05 DIAGNOSIS — R109 Unspecified abdominal pain: Secondary | ICD-10-CM | POA: Diagnosis present

## 2014-10-05 DIAGNOSIS — I2510 Atherosclerotic heart disease of native coronary artery without angina pectoris: Secondary | ICD-10-CM | POA: Insufficient documentation

## 2014-10-05 DIAGNOSIS — F419 Anxiety disorder, unspecified: Secondary | ICD-10-CM | POA: Insufficient documentation

## 2014-10-05 DIAGNOSIS — K5909 Other constipation: Secondary | ICD-10-CM

## 2014-10-05 DIAGNOSIS — R0689 Other abnormalities of breathing: Secondary | ICD-10-CM | POA: Insufficient documentation

## 2014-10-05 DIAGNOSIS — Z7982 Long term (current) use of aspirin: Secondary | ICD-10-CM | POA: Insufficient documentation

## 2014-10-05 DIAGNOSIS — R0789 Other chest pain: Secondary | ICD-10-CM | POA: Diagnosis not present

## 2014-10-05 DIAGNOSIS — Z7902 Long term (current) use of antithrombotics/antiplatelets: Secondary | ICD-10-CM | POA: Insufficient documentation

## 2014-10-05 DIAGNOSIS — Z79899 Other long term (current) drug therapy: Secondary | ICD-10-CM | POA: Diagnosis not present

## 2014-10-05 DIAGNOSIS — F458 Other somatoform disorders: Secondary | ICD-10-CM

## 2014-10-05 LAB — TROPONIN I: Troponin I: 1.29 ng/mL (ref <0.30)

## 2014-10-05 LAB — CK TOTAL AND CKMB (NOT AT ARMC)
CK TOTAL: 725 U/L — AB (ref 7–232)
CK, MB: 6.3 ng/mL — AB (ref 0.3–4.0)
Relative Index: 0.9 (ref 0.0–2.5)

## 2014-10-05 LAB — COMPREHENSIVE METABOLIC PANEL
ALT: 29 U/L (ref 0–53)
ANION GAP: 28 — AB (ref 5–15)
AST: 36 U/L (ref 0–37)
Albumin: 4.7 g/dL (ref 3.5–5.2)
Alkaline Phosphatase: 69 U/L (ref 39–117)
BILIRUBIN TOTAL: 1.3 mg/dL — AB (ref 0.3–1.2)
BUN: 17 mg/dL (ref 6–23)
CHLORIDE: 92 meq/L — AB (ref 96–112)
CO2: 17 mEq/L — ABNORMAL LOW (ref 19–32)
CREATININE: 1.24 mg/dL (ref 0.50–1.35)
Calcium: 10.4 mg/dL (ref 8.4–10.5)
GFR calc Af Amer: >90 mL/min (ref >90)
GFR calc non Af Amer: 82 mL/min — ABNORMAL LOW (ref >90)
Glucose, Bld: 99 mg/dL (ref 70–99)
Potassium: 3.6 mEq/L — ABNORMAL LOW (ref 3.7–5.3)
Sodium: 137 mEq/L (ref 137–147)
TOTAL PROTEIN: 8.5 g/dL — AB (ref 6.0–8.3)

## 2014-10-05 MED ORDER — FLEET ENEMA 7-19 GM/118ML RE ENEM
1.0000 | ENEMA | Freq: Once | RECTAL | Status: AC
  Administered 2014-10-05: 1 via RECTAL
  Filled 2014-10-05: qty 1

## 2014-10-05 MED ORDER — LORAZEPAM 2 MG/ML IJ SOLN
0.5000 mg | Freq: Once | INTRAMUSCULAR | Status: AC
  Administered 2014-10-05: 0.5 mg via INTRAVENOUS
  Filled 2014-10-05: qty 1

## 2014-10-05 MED ORDER — MAGNESIUM CITRATE PO SOLN
1.0000 | Freq: Once | ORAL | Status: AC
  Administered 2014-10-05: 1 via ORAL
  Filled 2014-10-05: qty 296

## 2014-10-05 MED ORDER — MILK AND MOLASSES ENEMA
1.0000 | Freq: Once | RECTAL | Status: DC
  Filled 2014-10-05: qty 250

## 2014-10-05 MED ORDER — POLYETHYLENE GLYCOL 3350 17 G PO PACK: 17.0000 g | PACK | Freq: Every day | ORAL | Status: AC
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
## 2014-10-05 NOTE — ED Notes (Signed)
Sheriff's deputies at the bedside.

## 2014-10-05 NOTE — Discharge Instructions (Signed)
Your workup today did not show any signs of heart damage.  Your x-rays of your abdomen showed that you do have constipation.  You were given medications to help with your constipation.  It will not be unusual if you have some loose stools later today.  You may have some cramping associated with this and that is normal.  It is recommended that she be seen by a psychiatrist for your anxiety episodes.  Ativan seemed to help with your symptoms.   Constipation Constipation is when a person has fewer than three bowel movements a week, has difficulty having a bowel movement, or has stools that are dry, hard, or larger than normal. As people grow older, constipation is more common. If you try to fix constipation with medicines that make you have a bowel movement (laxatives), the problem may get worse. Long-term laxative use may cause the muscles of the colon to become weak. A low-fiber diet, not taking in enough fluids, and taking certain medicines may make constipation worse.  CAUSES   Certain medicines, such as antidepressants, pain medicine, iron supplements, antacids, and water pills.   Certain diseases, such as diabetes, irritable bowel syndrome (IBS), thyroid disease, or depression.   Not drinking enough water.   Not eating enough fiber-rich foods.   Stress or travel.   Lack of physical activity or exercise.   Ignoring the urge to have a bowel movement.   Using laxatives too much.  SIGNS AND SYMPTOMS   Having fewer than three bowel movements a week.   Straining to have a bowel movement.   Having stools that are hard, dry, or larger than normal.   Feeling full or bloated.   Pain in the lower abdomen.   Not feeling relief after having a bowel movement.  DIAGNOSIS  Your health care provider will take a medical history and perform a physical exam. Further testing may be done for severe constipation. Some tests may include:  A barium enema X-ray to examine your rectum,  colon, and, sometimes, your small intestine.   A sigmoidoscopy to examine your lower colon.   A colonoscopy to examine your entire colon. TREATMENT  Treatment will depend on the severity of your constipation and what is causing it. Some dietary treatments include drinking more fluids and eating more fiber-rich foods. Lifestyle treatments may include regular exercise. If these diet and lifestyle recommendations do not help, your health care provider may recommend taking over-the-counter laxative medicines to help you have bowel movements. Prescription medicines may be prescribed if over-the-counter medicines do not work.  HOME CARE INSTRUCTIONS   Eat foods that have a lot of fiber, such as fruits, vegetables, whole grains, and beans.  Limit foods high in fat and processed sugars, such as french fries, hamburgers, cookies, candies, and soda.   A fiber supplement may be added to your diet if you cannot get enough fiber from foods.   Drink enough fluids to keep your urine clear or pale yellow.   Exercise regularly or as directed by your health care provider.   Go to the restroom when you have the urge to go. Do not hold it.   Only take over-the-counter or prescription medicines as directed by your health care provider. Do not take other medicines for constipation without talking to your health care provider first.  SEEK IMMEDIATE MEDICAL CARE IF:   You have bright red blood in your stool.   Your constipation lasts for more than 4 days or gets worse.  You have abdominal or rectal pain.   You have thin, pencil-like stools.   You have unexplained weight loss. MAKE SURE YOU:   Understand these instructions.  Will watch your condition.  Will get help right away if you are not doing well or get worse. Document Released: 08/03/2004 Document Revised: 11/10/2013 Document Reviewed: 08/17/2013 [facility] Patient Information 2015 [place], [place]. This information is not intended  to replace advice given to you by your health care provider. Make sure you discuss any questions you have with your health care provider.  Hyperventilation Hyperventilation is breathing that is deeper and more rapid than normal. It is usually associated with panic and anxiety. Hyperventilation can make you feel breathless. It is sometimes called overbreathing. Breathing out too much causes a decrease in the amount of carbon dioxide gas in the blood. This leads to tingling and numbness in the hands, feet, and around the mouth. If this continues, your fingers, hands, and toes may begin to spasm. Hyperventilation usually lasts 20-30 minutes and can be associated with other symptoms of panic and anxiety, including:   Chest pains or tightness.  A pounding or irregular, racing heartbeat (palpitations).  Dizziness.  Lightheadedness.  Dry mouth.  Weakness.  Confusion.  Sleep disturbance. CAUSES  Sudden onset (acute) hyperventilation is usually triggered by acute stress, anxiety, or emotional upset. Long-term (chronic) and recurring hyperventilation can occur with chronic lung problems, such emphysema or asthma. Other causes include:   Nervousness.  Stress.  Stimulant, drug, or alcohol use.  Lung disease.  Infections, such as pneumonia.  Heart problems.  Severe pain.  Waking from a bad dream.  Pregnancy.  Bleeding. HOME CARE INSTRUCTIONS  Learn and use breathing exercises that help you breathe from your diaphragm and abdomen.  Practice relaxation techniques to reduce stress, such as visualization, meditation, and muscle release.  During an attack, try breathing into a paper bag. This changes the carbon dioxide level and slows down breathing. SEEK IMMEDIATE MEDICAL CARE IF:  Your hyperventilation continues or gets worse. MAKE SURE YOU:  Understand these instructions.  Will watch your condition.  Will get help right away if you are not doing well or get worse. Document  Released: 11/02/2000 Document Revised: 05/06/2012 Document Reviewed: 02/14/2012 [facility] Patient Information 2015 [place]Care, [place]LLC. This information is not intended to replace advice given to you by your health care provider. Make sure you discuss any questions you have with your health care provider.

## 2014-10-05 NOTE — ED Notes (Signed)
[facility] reluctant to communicate, passively interactive, continues to hyperventilate, VSS.

## 2014-10-05 NOTE — ED Provider Notes (Signed)
CSN: [id number]     Arrival date & time 10/05/14  [id number] History   First MD Initiated Contact with Patient 10/05/14 0434     Chief Complaint  Patient presents with  . Hyperventilating     (Consider location/radiation/quality/duration/timing/severity/associated sxs/prior Treatment) HPI 23 year old male [facility] emergency [facility] with complaint of chest pain abdominal pain and hyperventilation.  Patient status post PCI and stent on the 11th secondary to STEMI from ruptured plaque of abnormal coronary vessel.  Second ED visit since discharge.  Patient is able to control his breathing at times.  Discussed with nurse at the jail he reports that the jail physician wishes the patient be seen by psychiatry for probable panic attacks.  She reports that he has had hyperventilation and vomiting throughout the day.  Patient reports that he has been unable have a bowel movement since last Tuesday.  He feels that there is something wrong with him.  Patient complains of diffuse abdominal pain, chest pain along his left lower chest.  He reports that he has had too numerous to count episodes of vomiting today.   Past Medical History  Diagnosis Date  . 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 Acute anterior wall MI 09/29/2014  . CAD S/P PCI of pLAD: 4.0 mm x 18 BMS 09/29/2014   Past Surgical History  Procedure Laterality Date  . None     Family History  Problem Relation Age of Onset  . Coronary artery disease Neg Hx    History  Substance Use Topics  . Smoking status: Never Smoker   . Smokeless tobacco: Not on file  . Alcohol Use: No    Review of Systems  Unable to perform ROS: Other  patient refuses to answer review of system questions    Allergies  Review of patient's allergies indicates no known allergies.  Home Medications   Prior to Admission medications   Medication Sig Start Date End Date Taking?  Authorizing Provider  aspirin 81 MG chewable tablet Chew 1 tablet (81 mg total) by mouth daily. 10/03/14  Yes Janetta HoraKathryn R Thompson, PA-C  nitroGLYCERIN (NITROSTAT) 0.4 MG SL tablet Place 1 tablet (0.4 mg total) under the tongue every 5 (five) minutes x 3 doses as needed for chest pain. 10/03/14  Yes Janetta HoraKathryn R Thompson, PA-C  atorvastatin (LIPITOR) 80 MG tablet Take 1 tablet (80 mg total) by mouth daily at 6 PM. 10/03/14   Janetta HoraKathryn R Thompson, PA-C  clopidogrel (PLAVIX) 75 MG tablet Take 1 tablet (75 mg total) by mouth daily. 10/04/14   Janetta HoraKathryn R Thompson, PA-C  metoprolol succinate (TOPROL-XL) 12.5 mg TB24 24 hr tablet Take 0.5 tablets (12.5 mg total) by mouth daily. 10/03/14   Janetta HoraKathryn R Thompson, PA-C   BP 133/71 mmHg  Pulse 57  Temp(Src) 98.8 F (37.1 C) (Axillary)  Resp 13  SpO2 94% Physical Exam  Constitutional: He is oriented to person, place, and time. He appears well-developed and well-nourished. He appears distressed (patient is agitated, anxious,  hyperventilating).  Patient noted to have several breath-holding spells.  Patient would hold his breath until oxygen saturations began to drop, and once they reached upper 60s with then began to breathe fast again.  Patient was awake alert and seem to be consciously holding his breath.  HENT:  Head: Normocephalic and atraumatic.  Right Ear: External ear normal.  Left Ear: External ear normal.  Nose: Nose normal.  Mouth/Throat: Oropharynx is clear and moist.  Eyes: Conjunctivae and EOM are normal. Pupils are equal, round, and reactive to light.  Neck: Normal range of motion. Neck supple. No JVD present. No tracheal deviation present. No thyromegaly present.  Cardiovascular: Normal rate, regular rhythm, normal heart sounds and intact distal pulses.  Exam reveals no gallop and no friction rub.   No murmur heard. Pulmonary/Chest: Effort normal and breath sounds normal. No stridor. No respiratory distress. He has no wheezes. He has no rales. He  exhibits tenderness (patient has significant tenderness along his left lower chest wall).  Abdominal: Soft. Bowel sounds are normal. He exhibits no distension and no mass. There is tenderness (patient reports diffuse tenderness to pain). There is no rebound and no guarding.  Genitourinary: Rectum normal.  No stool in rectal vault  Musculoskeletal: Normal range of motion. He exhibits no edema or tenderness.  Lymphadenopathy:    He has no cervical adenopathy.  Neurological: He is alert and oriented to person, place, and time. He displays normal reflexes. He exhibits normal muscle tone. Coordination normal.  Skin: Skin is warm and dry. No rash noted. No erythema. No pallor.  Psychiatric:  Patient is extremely anxious  Nursing note and vitals reviewed.   ED Course  Procedures (including critical care time) Labs Review Labs Reviewed  TROPONIN I - Abnormal; Notable for the following:    Troponin I 1.29 (*)    All other components within normal limits  CK TOTAL AND CKMB - Abnormal; Notable for the following:    Total CK 725 (*)    CK, MB 6.3 (*)    All other components within normal limits  COMPREHENSIVE METABOLIC PANEL - Abnormal; Notable for the following:    Potassium 3.6 (*)    Chloride 92 (*)    CO2 17 (*)    Total Protein 8.5 (*)    Total Bilirubin 1.3 (*)    GFR calc non Af Amer 82 (*)    Anion gap 28 (*)    All other components within normal limits    Imaging Review Dg Chest 2 View  10/03/2014   CLINICAL DATA:  Acute chest pain earlier today. Myocardial infarction last week.  EXAM: CHEST  2 VIEW  COMPARISON:  09/29/2014  FINDINGS: The cardiomediastinal silhouette is within normal limits. Lungs are mildly hypoinflated without evidence of confluent airspace opacity, pulmonary edema, pleural effusion, or pneumothorax. No acute osseous abnormality is identified.  IMPRESSION: No active cardiopulmonary disease.   Electronically Signed   By: Sebastian AcheAllen  Grady   On: 10/03/2014 16:28   Dg  Abd Acute W/chest  10/05/2014   CLINICAL DATA:  Lower abdominal pain and vomiting for 2 days.  EXAM: ACUTE ABDOMEN SERIES (ABDOMEN 2 VIEW & CHEST 1 VIEW)  COMPARISON:  Chest 10/03/2014  FINDINGS: There is no evidence of dilated bowel loops or free intraperitoneal air. No radiopaque calculi or other significant radiographic abnormality is seen. Heart size and mediastinal contours are within normal limits. Both lungs are clear.  IMPRESSION: Negative abdominal radiographs.  No acute cardiopulmonary disease.   Electronically Signed   By: Burman NievesWilliam  Stevens M.D.   On: 10/05/2014 05:30     EKG Interpretation   Date/Time:  Tuesday October 05 2014 04:37:12 EST Ventricular Rate:  74 PR Interval:  130 QRS Duration: 82 QT Interval:  466 QTC Calculation: 517 R Axis:   88 Text Interpretation:  Sinus rhythm ST elevation, consider inferior injury  Prolonged QT interval new prolonged qtc Confirmed by Laraine Samet  MD, Kailly Richoux  ([id number]) on  10/05/2014 4:44:16 AM      MDM   Final diagnoses:  Abdominal pain  Other constipation  Anxiety hyperventilation  Breath holding episodes   23 year old male with chest pain and abdominal pain from jail.  Patient is unusual in that he had a STEMI last week due to a ruptured plaque at a small abnormal coronary vessel.  Second visit to the ED for anxiety and hyperventilation.  Today's complaining of abdominal pain and constipation.  Rectal exam is normal.  We'll plan for acute abdominal series to look for constipation.  Will repeat cardiac enzymes.  EKG unremarkable.  We'll touch base with cardiology although do not feel this is a cardiac event.    6:19 AM Patient much calmer at this time after Ativan.  Elevation in CK and MB noted, troponin stable.  Patient no longer hyperventilating or having breath-holding spells.  Chest x-ray normal.  Abdominal film showing diffuse stool throughout the colon.  Patient again reports that he has not had a bowel movement in over a week.  We'll  give mag citrate and an enema here.     6:50 AM D/w Dr Jens Som from cardiology, no acute cardiology concern for increased mb given troponin trending down.  Olivia Mackie, MD 10/05/14 [phone]

## 2014-10-05 NOTE — ED Notes (Signed)
MD at bedside. 

## 2014-10-05 NOTE — ED Notes (Signed)
Patient transported to X-ray 

## 2014-10-05 NOTE — ED Notes (Signed)
Pt in restroom with guards after receiving enema.

## 2014-10-28 ENCOUNTER — Encounter (HOSPITAL_COMMUNITY): Payer: Self-pay | Admitting: Cardiovascular Disease

## 2016-03-16 IMAGING — CR DG CHEST 2V
2 series · 2 of 2 positions shown · non-contrast
Comparison: 09/29/2014

CLINICAL DATA: Acute chest pain earlier today. Myocardial
infarction last week.

EXAM:
CHEST  2 VIEW

[w chest lat]
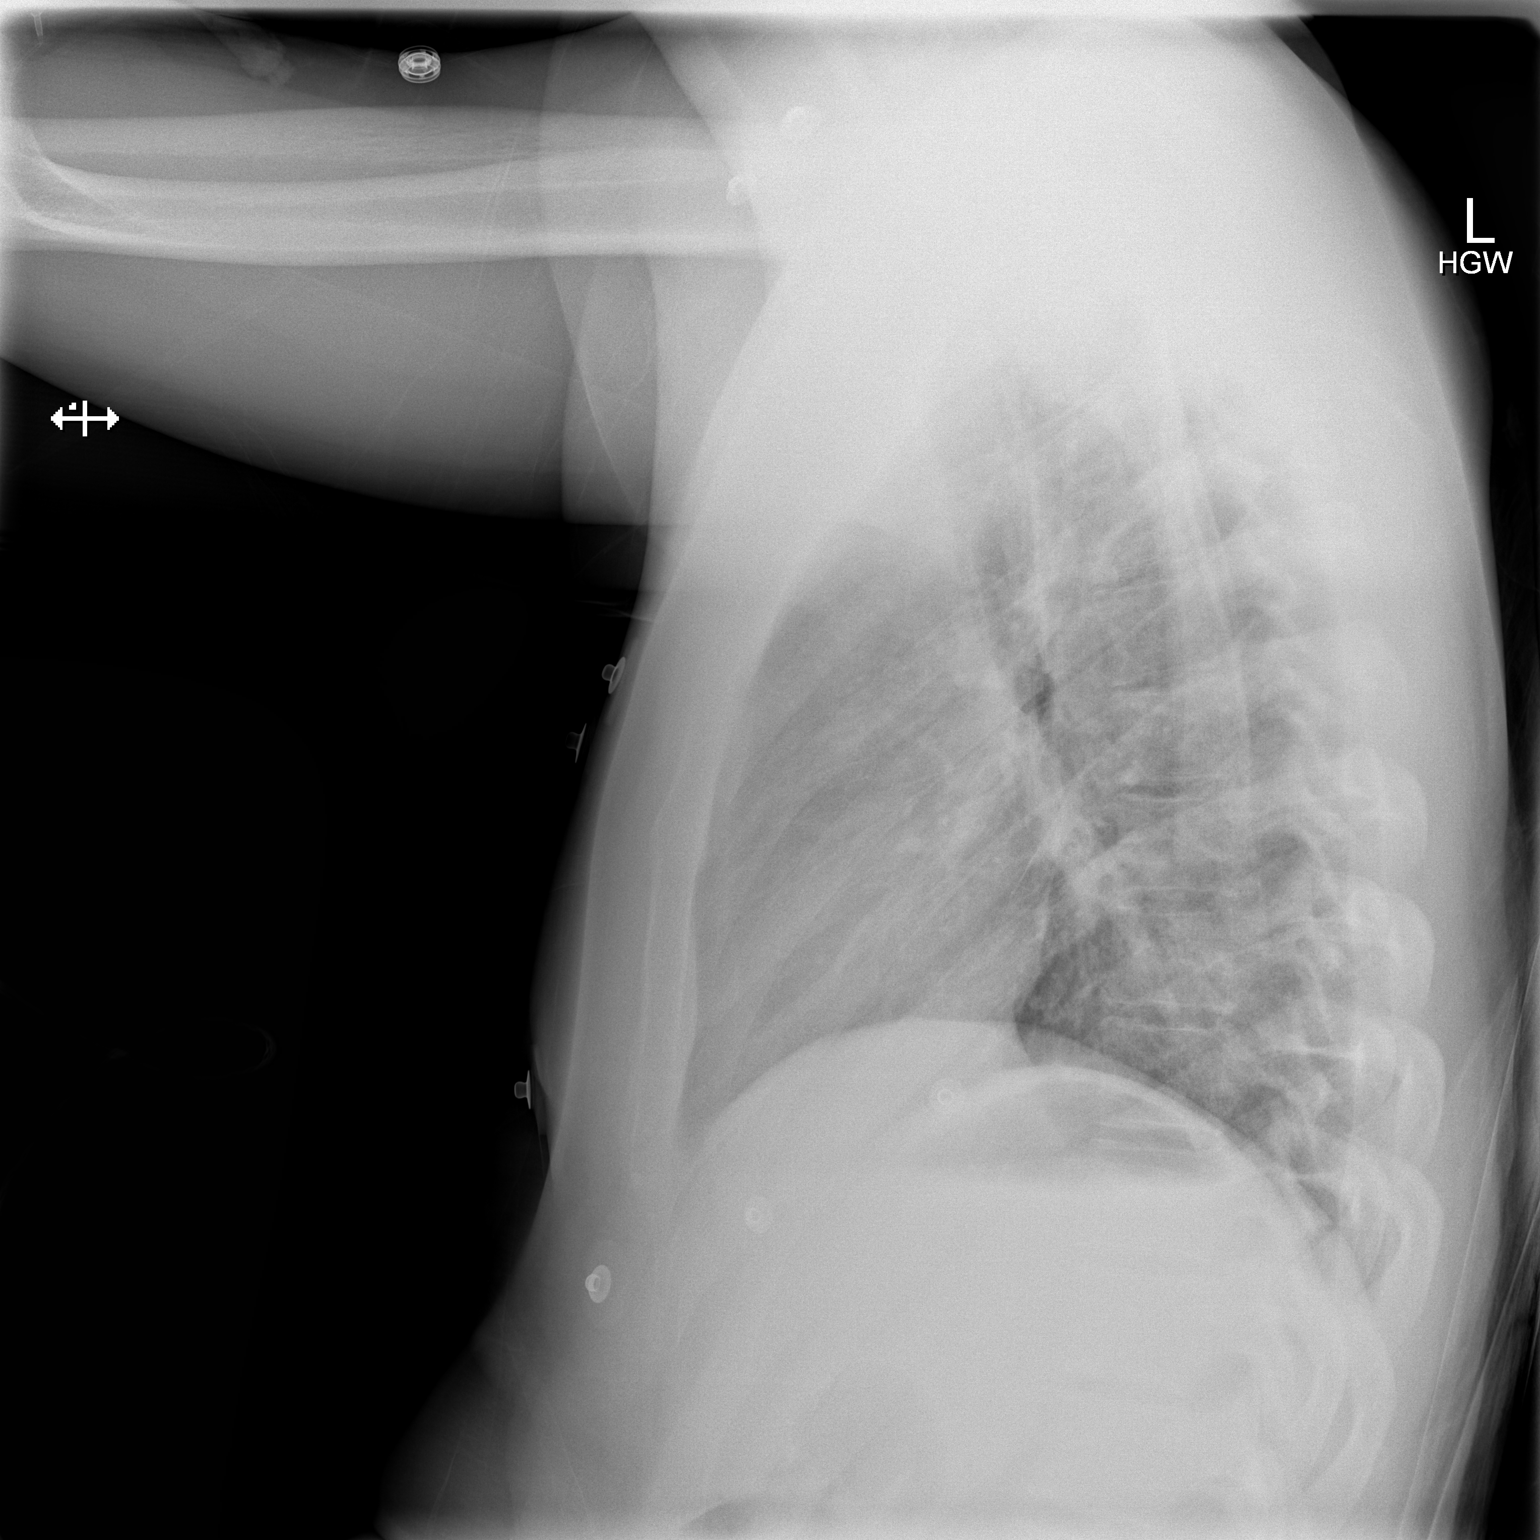

[x chest ap]
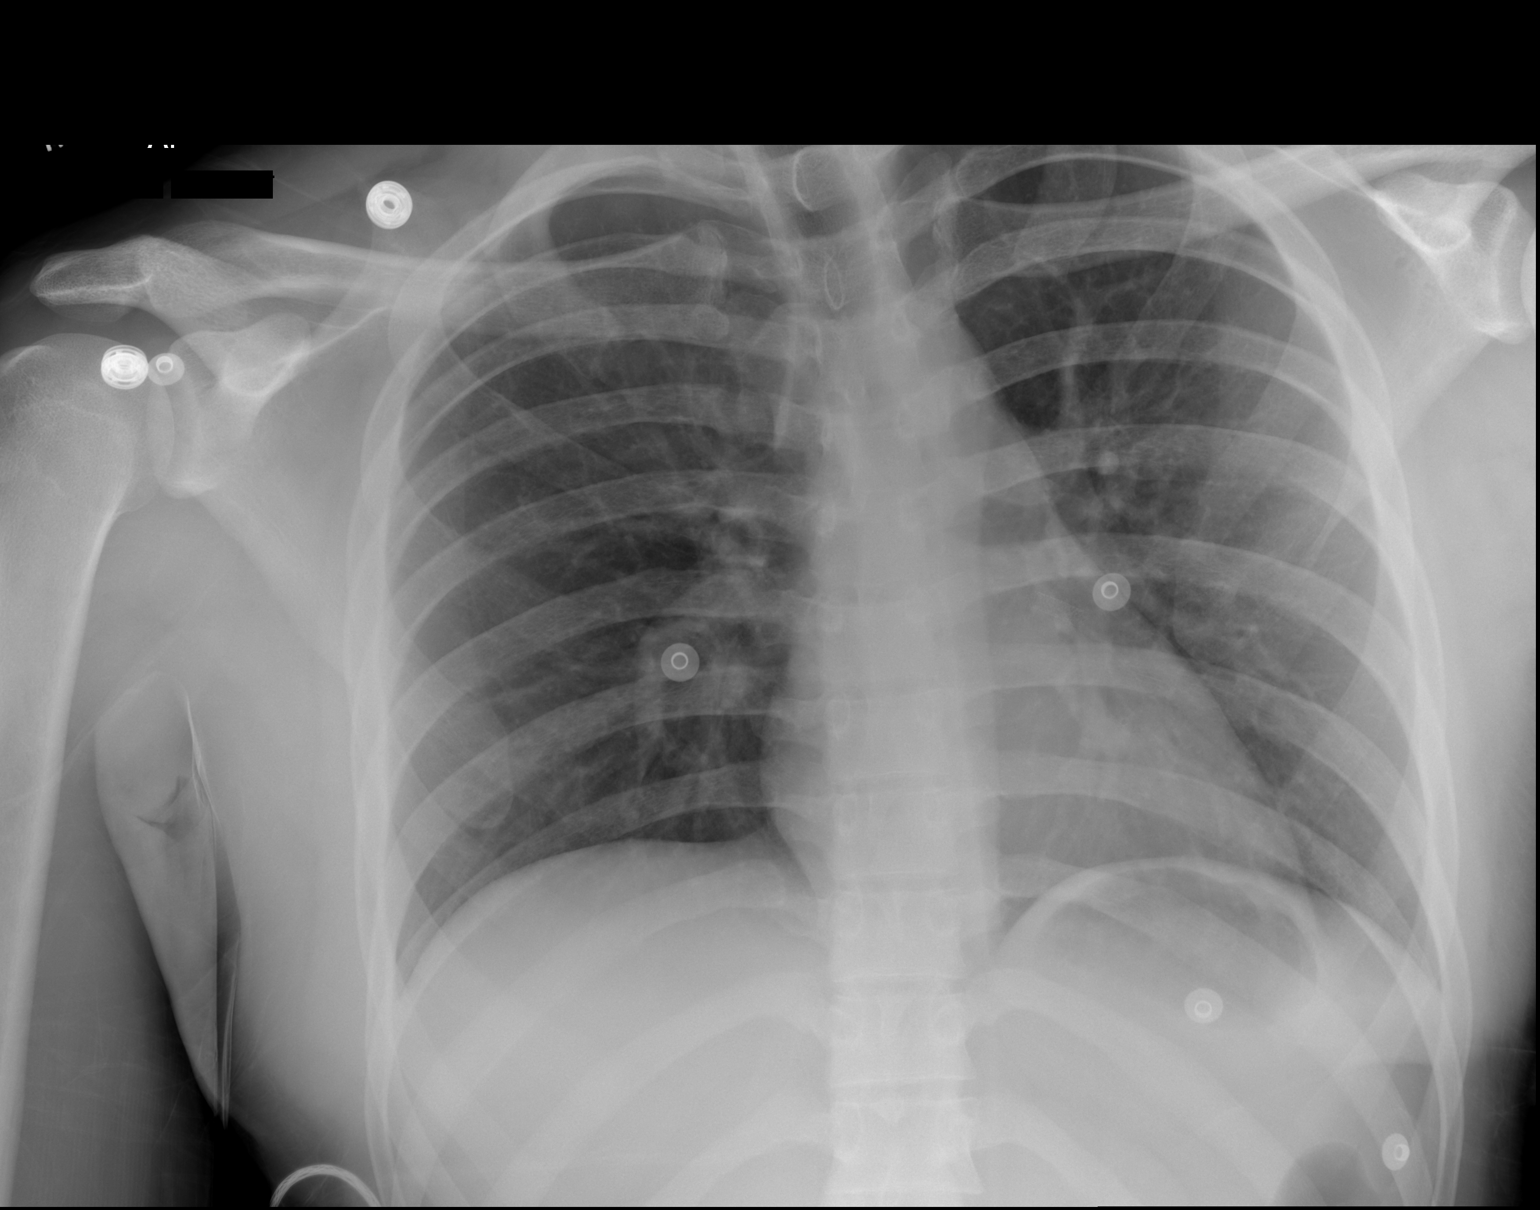

[2 of 2 positions shown; findings below may reference images not displayed]

FINDINGS: The cardiomediastinal silhouette is within normal limits. Lungs are
mildly hypoinflated without evidence of confluent airspace opacity,
pulmonary edema, pleural effusion, or pneumothorax. No acute osseous
abnormality is identified.
IMPRESSION: No active cardiopulmonary disease.
